# Patient Record
Sex: Female | Born: 1955 | Race: White | Hispanic: No | Marital: Married
Health system: Southern US, Community
[De-identification: ages and names within clinical notes are randomized; demographics above are authoritative.]

---

## 2000-03-29 ENCOUNTER — Ambulatory Visit (HOSPITAL_COMMUNITY): Admission: RE | Admit: 2000-03-29 | Discharge: 2000-03-29 | Payer: Self-pay | Admitting: *Deleted

## 2000-03-29 ENCOUNTER — Encounter: Payer: Self-pay | Admitting: *Deleted

## 2000-03-30 ENCOUNTER — Emergency Department (HOSPITAL_COMMUNITY): Admission: EM | Admit: 2000-03-30 | Discharge: 2000-03-30 | Payer: Self-pay | Admitting: Emergency Medicine

## 2000-07-02 ENCOUNTER — Emergency Department (HOSPITAL_COMMUNITY): Admission: EM | Admit: 2000-07-02 | Discharge: 2000-07-02 | Payer: Self-pay | Admitting: Emergency Medicine

## 2002-03-02 ENCOUNTER — Other Ambulatory Visit: Admission: RE | Admit: 2002-03-02 | Discharge: 2002-03-02 | Payer: Self-pay | Admitting: Obstetrics and Gynecology

## 2002-03-14 ENCOUNTER — Encounter (INDEPENDENT_AMBULATORY_CARE_PROVIDER_SITE_OTHER): Payer: Self-pay | Admitting: *Deleted

## 2002-03-14 ENCOUNTER — Ambulatory Visit (HOSPITAL_COMMUNITY): Admission: RE | Admit: 2002-03-14 | Discharge: 2002-03-14 | Payer: Self-pay | Admitting: Obstetrics and Gynecology

## 2002-03-14 ENCOUNTER — Inpatient Hospital Stay (HOSPITAL_COMMUNITY): Admission: AD | Admit: 2002-03-14 | Discharge: 2002-03-19 | Payer: Self-pay | Admitting: Psychiatry

## 2002-03-24 ENCOUNTER — Encounter: Admission: RE | Admit: 2002-03-24 | Discharge: 2002-03-24 | Payer: Self-pay | Admitting: *Deleted

## 2002-03-27 ENCOUNTER — Encounter: Admission: RE | Admit: 2002-03-27 | Discharge: 2002-03-27 | Payer: Self-pay | Admitting: *Deleted

## 2002-04-03 ENCOUNTER — Encounter: Admission: RE | Admit: 2002-04-03 | Discharge: 2002-04-03 | Payer: Self-pay | Admitting: *Deleted

## 2002-04-08 ENCOUNTER — Encounter: Admission: RE | Admit: 2002-04-08 | Discharge: 2002-04-08 | Payer: Self-pay | Admitting: *Deleted

## 2002-04-14 ENCOUNTER — Encounter: Admission: RE | Admit: 2002-04-14 | Discharge: 2002-04-14 | Payer: Self-pay | Admitting: Psychiatry

## 2002-04-28 ENCOUNTER — Encounter: Admission: RE | Admit: 2002-04-28 | Discharge: 2002-04-28 | Payer: Self-pay | Admitting: Psychiatry

## 2002-04-30 ENCOUNTER — Encounter: Admission: RE | Admit: 2002-04-30 | Discharge: 2002-04-30 | Payer: Self-pay | Admitting: *Deleted

## 2002-05-11 ENCOUNTER — Encounter: Admission: RE | Admit: 2002-05-11 | Discharge: 2002-05-11 | Payer: Self-pay | Admitting: Psychiatry

## 2002-05-19 ENCOUNTER — Encounter: Admission: RE | Admit: 2002-05-19 | Discharge: 2002-05-19 | Payer: Self-pay | Admitting: Psychiatry

## 2002-05-24 ENCOUNTER — Emergency Department (HOSPITAL_COMMUNITY): Admission: EM | Admit: 2002-05-24 | Discharge: 2002-05-24 | Payer: Self-pay | Admitting: Emergency Medicine

## 2002-06-09 ENCOUNTER — Encounter: Admission: RE | Admit: 2002-06-09 | Discharge: 2002-06-09 | Payer: Self-pay | Admitting: Psychiatry

## 2002-06-22 ENCOUNTER — Encounter: Admission: RE | Admit: 2002-06-22 | Discharge: 2002-06-22 | Payer: Self-pay | Admitting: Psychiatry

## 2002-08-24 ENCOUNTER — Encounter: Admission: RE | Admit: 2002-08-24 | Discharge: 2002-08-24 | Payer: Self-pay | Admitting: Psychiatry

## 2002-09-01 ENCOUNTER — Encounter: Admission: RE | Admit: 2002-09-01 | Discharge: 2002-09-01 | Payer: Self-pay | Admitting: *Deleted

## 2002-09-14 ENCOUNTER — Encounter: Admission: RE | Admit: 2002-09-14 | Discharge: 2002-09-14 | Payer: Self-pay | Admitting: Psychiatry

## 2002-09-29 ENCOUNTER — Encounter: Admission: RE | Admit: 2002-09-29 | Discharge: 2002-09-29 | Payer: Self-pay | Admitting: Psychiatry

## 2002-11-30 ENCOUNTER — Inpatient Hospital Stay (HOSPITAL_COMMUNITY): Admission: EM | Admit: 2002-11-30 | Discharge: 2002-12-04 | Payer: Self-pay | Admitting: Psychiatry

## 2003-02-22 ENCOUNTER — Encounter: Admission: RE | Admit: 2003-02-22 | Discharge: 2003-02-22 | Payer: Self-pay | Admitting: Psychiatry

## 2003-03-30 ENCOUNTER — Other Ambulatory Visit: Admission: RE | Admit: 2003-03-30 | Discharge: 2003-03-30 | Payer: Self-pay | Admitting: Obstetrics and Gynecology

## 2003-04-01 ENCOUNTER — Encounter: Payer: Self-pay | Admitting: Obstetrics and Gynecology

## 2003-04-01 ENCOUNTER — Ambulatory Visit (HOSPITAL_COMMUNITY): Admission: RE | Admit: 2003-04-01 | Discharge: 2003-04-01 | Payer: Self-pay | Admitting: Obstetrics and Gynecology

## 2003-04-07 ENCOUNTER — Encounter: Payer: Self-pay | Admitting: Obstetrics and Gynecology

## 2003-04-07 ENCOUNTER — Ambulatory Visit (HOSPITAL_COMMUNITY): Admission: RE | Admit: 2003-04-07 | Discharge: 2003-04-07 | Payer: Self-pay | Admitting: Obstetrics and Gynecology

## 2003-04-26 ENCOUNTER — Encounter: Admission: RE | Admit: 2003-04-26 | Discharge: 2003-04-26 | Payer: Self-pay | Admitting: *Deleted

## 2003-04-26 ENCOUNTER — Encounter: Payer: Self-pay | Admitting: Obstetrics and Gynecology

## 2003-04-26 ENCOUNTER — Ambulatory Visit (HOSPITAL_COMMUNITY): Admission: RE | Admit: 2003-04-26 | Discharge: 2003-04-26 | Payer: Self-pay | Admitting: Obstetrics and Gynecology

## 2003-05-12 ENCOUNTER — Encounter: Payer: Self-pay | Admitting: Obstetrics and Gynecology

## 2003-05-12 ENCOUNTER — Ambulatory Visit (HOSPITAL_COMMUNITY): Admission: AD | Admit: 2003-05-12 | Discharge: 2003-05-12 | Payer: Self-pay | Admitting: Obstetrics and Gynecology

## 2004-04-17 ENCOUNTER — Other Ambulatory Visit: Admission: RE | Admit: 2004-04-17 | Discharge: 2004-04-17 | Payer: Self-pay | Admitting: Obstetrics and Gynecology

## 2005-02-17 ENCOUNTER — Emergency Department (HOSPITAL_COMMUNITY): Admission: EM | Admit: 2005-02-17 | Discharge: 2005-02-17 | Payer: Self-pay | Admitting: Emergency Medicine

## 2005-06-18 ENCOUNTER — Other Ambulatory Visit: Admission: RE | Admit: 2005-06-18 | Discharge: 2005-06-18 | Payer: Self-pay | Admitting: Obstetrics and Gynecology

## 2008-04-22 ENCOUNTER — Encounter: Admission: RE | Admit: 2008-04-22 | Discharge: 2008-04-22 | Payer: Self-pay | Admitting: Obstetrics and Gynecology

## 2008-04-26 ENCOUNTER — Encounter (INDEPENDENT_AMBULATORY_CARE_PROVIDER_SITE_OTHER): Payer: Self-pay | Admitting: Diagnostic Radiology

## 2008-04-26 ENCOUNTER — Encounter: Admission: RE | Admit: 2008-04-26 | Discharge: 2008-04-26 | Payer: Self-pay | Admitting: Obstetrics and Gynecology

## 2008-05-20 ENCOUNTER — Encounter (INDEPENDENT_AMBULATORY_CARE_PROVIDER_SITE_OTHER): Payer: Self-pay | Admitting: Surgery

## 2008-05-20 ENCOUNTER — Ambulatory Visit (HOSPITAL_BASED_OUTPATIENT_CLINIC_OR_DEPARTMENT_OTHER): Admission: RE | Admit: 2008-05-20 | Discharge: 2008-05-20 | Payer: Self-pay | Admitting: Surgery

## 2008-05-20 ENCOUNTER — Encounter: Admission: RE | Admit: 2008-05-20 | Discharge: 2008-05-20 | Payer: Self-pay | Admitting: Surgery

## 2008-08-11 ENCOUNTER — Encounter: Admission: RE | Admit: 2008-08-11 | Discharge: 2008-08-11 | Payer: Self-pay | Admitting: Gastroenterology

## 2008-08-23 ENCOUNTER — Encounter: Admission: RE | Admit: 2008-08-23 | Discharge: 2008-08-23 | Payer: Self-pay | Admitting: Obstetrics and Gynecology

## 2009-07-27 ENCOUNTER — Encounter: Admission: RE | Admit: 2009-07-27 | Discharge: 2009-07-27 | Payer: Self-pay | Admitting: Obstetrics and Gynecology

## 2009-08-01 ENCOUNTER — Encounter: Admission: RE | Admit: 2009-08-01 | Discharge: 2009-08-01 | Payer: Self-pay | Admitting: Obstetrics and Gynecology

## 2009-08-29 ENCOUNTER — Emergency Department (HOSPITAL_COMMUNITY): Admission: EM | Admit: 2009-08-29 | Discharge: 2009-08-29 | Payer: Self-pay | Admitting: Emergency Medicine

## 2009-09-27 ENCOUNTER — Encounter: Admission: RE | Admit: 2009-09-27 | Discharge: 2009-09-27 | Payer: Self-pay | Admitting: Surgery

## 2009-09-27 ENCOUNTER — Ambulatory Visit (HOSPITAL_BASED_OUTPATIENT_CLINIC_OR_DEPARTMENT_OTHER): Admission: RE | Admit: 2009-09-27 | Discharge: 2009-09-27 | Payer: Self-pay | Admitting: Surgery

## 2009-09-27 ENCOUNTER — Encounter (INDEPENDENT_AMBULATORY_CARE_PROVIDER_SITE_OTHER): Payer: Self-pay | Admitting: Surgery

## 2010-10-26 ENCOUNTER — Ambulatory Visit: Payer: Self-pay | Admitting: Specialist

## 2010-12-30 ENCOUNTER — Emergency Department: Payer: Self-pay | Admitting: Emergency Medicine

## 2011-03-01 ENCOUNTER — Ambulatory Visit: Payer: Self-pay | Admitting: Specialist

## 2011-03-07 ENCOUNTER — Ambulatory Visit: Payer: Self-pay | Admitting: Specialist

## 2011-04-07 LAB — POCT I-STAT, CHEM 8
Creatinine, Ser: 0.7 mg/dL (ref 0.4–1.2)
HCT: 41 % (ref 36.0–46.0)
Hemoglobin: 13.9 g/dL (ref 12.0–15.0)
Potassium: 3.8 mEq/L (ref 3.5–5.1)
Sodium: 138 mEq/L (ref 135–145)

## 2011-04-07 LAB — POCT CARDIAC MARKERS
CKMB, poc: <1 ng/mL — ABNORMAL LOW (ref 1.0–8.0)
Myoglobin, poc: 55.1 ng/mL (ref 12–200)

## 2011-05-15 NOTE — Op Note (Signed)
Sydney Andrews, Sydney Andrews             ACCOUNT NO.:  [id number]   MEDICAL RECORD NO.:  [id number]          PATIENT TYPE:  AMB   LOCATION:  DSC                          FACILITY:  [facility]   PHYSICIAN:  Currie Paris, M.D.DATE OF BIRTH:  16-Feb-1956   DATE OF PROCEDURE:  05/20/2008  DATE OF DISCHARGE:                               OPERATIVE REPORT   PREOPERATIVE DIAGNOSES:  Calcifications, left breast, upper outer  quadrant with atypical hyperplasia, on core biopsy.   POSTOPERATIVE DIAGNOSES:  Calcifications, left breast, upper outer  quadrant with atypical hyperplasia, on core biopsy.   OPERATION:  Needle-localized excisional biopsy of left breast  calcifications.   SURGEON:  Currie Paris, MD   ANESTHESIA:  MAC.   CLINICAL HISTORY:  This is a 55 year old lady who had an area of  abnormality biopsied and some atypical hyperplasia was seen.  Wider  excisional biopsy was recommended.  A clip had been placed at the time  of the original mammogram.   DESCRIPTION OF PROCEDURE:  The patient was seen in the holding area.  She had no further questions.  We both initialed the left breast as the  operative site.  I reviewed the mammogram films and the guidewire  appeared to go beyond the clip and be very close to it tracking from  lateral to medial on the CC view.   The patient was taken to the operating room.  After satisfactory IV  sedation, the left breast was prepped and draped.  The time-out was  done.   I infiltrated Marcaine and made a transverse incision starting about a  centimeter medial to where the guidewire entered the skin, divided the  fatty tissue, and then a little bit of breast tissue laterally until I  could identify the guidewire, and then I manipulated that into the  wound.  I then grasped the guidewire tract and excised a cylinder of  tissue around it with continued ongoing traction, I was able to do so  with the guidewire tracking fairly close to my deep  margin, but I was  also fairly close to chest wall in doing this.  I got beyond the tip and  divided it so that we had an excisional biopsy, and we did a specimen  mammogram.  I put additional local and using a combination 1% Xylocaine  and 0.5% Marcaine with epinephrine mixed equally.  I put it in the  deeper layers and around the chest wall.   Specimen mammogram showed the guidewire and the clip to be in it, but  there were some calcifications noted at the very medial end of the  biopsy, so I went back and took additional centimeters of tissue and by  this time, I was well under the areolar complex.   I made sure everything was dry, put in more local, and closed in layers  with 3-0 Vicryl and 4-0 Monocryl subcuticular and Dermabond.   The patient the tolerated procedure well, and there no complications.  All counts were correct.      Currie Paris, M.D.  Electronically Signed     CJS/MEDQ  D:  05/20/2008  T:  05/21/2008  Job:  [id number]

## 2011-05-18 NOTE — H&P (Signed)
NAME:  Sydney Andrews, Sydney Andrews                       ACCOUNT NO.:  [id number]   MEDICAL RECORD NO.:  [id number]                   PATIENT TYPE:  IPS   LOCATION:  0303                                 FACILITY:  [facility]   PHYSICIAN:  Jeanice Lim, M.D.              DATE OF BIRTH:  11-24-1956   DATE OF ADMISSION:  11/30/2002  DATE OF DISCHARGE:  12/04/2002                         PSYCHIATRIC ADMISSION ASSESSMENT   IDENTIFYING INFORMATION:  This is a 55 year old married white female who is  a voluntary admission.   HISTORY OF PRESENT ILLNESS:  This patient was transferred from [facility].  She has a history of depression and went off Prozac and  Wellbutrin when she initially became pregnant, then she experienced a fetal  death at 10 weeks of pregnancy and was admitted to [facility] on March  15 for a D&C.  She endorses depression symptoms over the past few weeks and  suicidal thoughts for the past few days, stating I've nothing to live for,  no purpose in my life.  I don't know what to do.  Feeling hopeless and  depressed after losing the baby since she had worked on becoming pregnant  through in vitro fertilization for the past 2 years.  The patient has had  thoughts of jumping out in front of a car.  She denies any homicidal  ideation or auditory or visual hallucinations.  She endorses general  feelings of sadness, hopelessness and helplessness.   PAST PSYCHIATRIC HISTORY:  This is the patient's first psychiatric inpatient  admission.  She does have a history of 1 prior suicide attempt by overdose  in the past.  This overdose was approximately 20 years ago.   SOCIAL HISTORY:  The patient lives in [place] with her husband and her  husband's 2 children by a prior marriage who are age 69 and 3.  She is  thinking that she may choose to adopt now that she has spent so much effort  trying to get pregnant and now lost the baby.  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   FAMILY HISTORY:  Unremarkable.   ALCOHOL AND DRUG HISTORY:  The patient denies any substance abuse now or in  the past.   PAST MEDICAL HISTORY:  The patient is generally healthy, has been followed  primarily by her Ob/Gyn physician.  Medical problems:  She denies.  She is  status post spontaneous abortion at 10 weeks fetal gestation.   MEDICATIONS:  Darvocet for discomfort following her D&C.   DRUG ALLERGIES:  None.   POSITIVE PHYSICAL FINDINGS:  The patient's physical examination was done at  [facility] and is noted in the record.  The patient's vital signs are  within normal limits.  On admission to the unit, she is 5 feet 2 inches  tall, 129 pounds.  Her CBC and metabolic panel were within normal limits.  Her thyroid panel  is currently pending.  She is having a moderate amount of  vaginal bleeding with very few clots, nothing excessive and she has no other  somatic complaints.   REVIEW OF SYSTEMS:  Remarkable for some suicidal thoughts and being very  depressed since she learned the pregnancy was not going well, but prior to  that she was quite healthy, denies any other somatic complaints.   MENTAL STATUS EXAM:  This is a fatigued looking female who is fully alert  and otherwise healthy in appearance.  She is in no acute distress.  She has  sad, blunted affect, is cooperative and polite.  Speech is normal.  Her mood  is generally depressed, feeling hopeless about the future and grieving the  loss of her Prenancy and the future that she hoped to have with that child.  She feels that she has nothing to look forward to, after she had gotten her  hopes up about the pregnancy.  Thought process includes vague suicidal  thoughts without any clear plan of how she would really commit suicide.  She  did have thoughts before about possibly jumping out of the car when she was  riding in it.  Today she is able to promise safety on the unit.  Thoughts  are generally logical,  no signs of psychosis, no homicidal ideation.  Cognitively she is intact and oriented x3.  Intelligence is average, insight  is fair.  Judgment and impulse control within normal limits.   ADMISSION DIAGNOSIS:   AXIS I:  Major depression, recurrent, severe.   AXIS II:  Deferred.   AXIS III:  Status post D&C on 3/15.   AXIS IV:  Moderate to severe, grief over the loss of her pregnancy.  Having  supportive family and supportive husband is an asset to her.   AXIS V:  Current 32, past year 84.   INITIAL PLAN OF CARE:  Voluntarily admit the patient to evaluate her mood  and to alleviate her suicidal ideation.  We are going to restart her Prozac  at 20 mg daily since she had had good results from that in the distant past,  and we will also restart her Wellbutrin SR at 100 mg b.i.d.  Meanwhile, we  will give her Darvocet for discomfort and monitor her vaginal bleeding  closely.   ESTIMATED LENGTH OF STAY:  5 days.      Margaret A. Stephannie Peters                   Jeanice Lim, M.D.    MAS/MEDQ  D:  03/15/2003  T:  03/15/2003  Job:  [id number]

## 2011-05-18 NOTE — Op Note (Signed)
[facility]  Patient:    Sydney Andrews, Sydney Andrews Visit Number: [id number] MRN: [id number]          Service Type: PSY Location: 300 0304 01 Attending Physician:  Jeanice Lim Dictated by:   Guy Sandifer Arleta Creek, M.D. Proc. Date: 03/14/02 Admit Date:  03/14/2002                             Operative Report  PREOPERATIVE DIAGNOSES:       Missed abortion.  POSTOPERATIVE DIAGNOSES:      Missed abortion.  PROCEDURE:                    Dilatation and evacuation.  SURGEON:                      Guy Sandifer. Arleta Creek, M.D.  ANESTHESIA:                   MAC with 1% Xylocaine paracervical block.  ESTIMATED BLOOD LOSS:         Less than 100 cc.  INDICATIONS AND CONSENT:      This patient is a 55 year old married white female G4, P2, AB1 at approximately 24 5/7 weeks estimated gestational age with missed abortion.  Details are dictated in the history and physical. Dilatation and evacuation has been discussed with the patient and her husband. Potential risks and complications were discussed including, but not limited to, infection, uterine perforation, organ damage, bleeding requiring transfusion of blood products with possible transfusion reaction, HIV and hepatitis acquisition, DVT, PE, injury and synechia formation, hysterectomy. All questions have been answered and consent is signed on the chart.  PROCEDURE:                    Patient is taken to the operating room and placed in dorsal supine position where sedation is given.  She is then placed in the dorsal lithotomy position where she is prepped, bladder straight catheterized, and she is draped in a sterile fashion.  Bivalve speculum was placed in the vagina.  The anterior cervical lip is injected with 1% Xylocaine and grasped with a single tooth tenaculum.  Paracervical block is placed in the 2, 4, 5, 7, 8, and 10 oclock positions with approximately 20 cc total of 1% Xylocaine plain.  Cervix is gently  progressively dilated to a 31 Pratt dilator.  A #8 curved suction curette is then passed and suction curettage is carried out for obvious products of conception.  Pitocin is added to the IV after initial pass of the suction curette.  Alternating sharp and suction curettage is carried out until the cavity is clean.  Good hemostasis is noted. Instruments are removed.  The patient is awakened and taken to the recovery room in stable condition.  Blood type is A+. Dictated by:   Guy Sandifer Arleta Creek, M.D. Attending Physician:  Jeanice Lim DD:  03/14/02 TD:  03/16/02 Job: [id number]

## 2011-05-18 NOTE — Discharge Summary (Signed)
[facility]  Patient:    Sydney Andrews, Sydney Andrews Visit Number: [id number] MRN: [id number]          Service Type: PSY Location: 300 0303 01 Attending Physician:  Jeanice Lim Dictated by:   Jeanice Lim, M.D. Admit Date:  03/14/2002 Discharge Date: 03/19/2002                             Discharge Summary  IDENTIFYING DATA:  This is a 55 year old married Caucasian female voluntarily admitted with a history of depression, off of Prozac and Wellbutrin for a week after becoming pregnant.  At 10 weeks, the patient went to the [facility] for a [facility] and has had suicidal ideation, feeling that she had nothing to live for.  ADMISSION MEDICATIONS:  Darvocet for pain.  ALLERGIES:  No known allergies.  PHYSICAL EXAMINATION:  GENERAL:  Essentially within normal limits.  NEUROLOGIC:  Nonfocal.  ROUTINE ADMISSION LABORATORY DATA:  Essentially within normal limits.  MENTAL STATUS EXAMINATION:  Fully alert, tired looking female in no acute distress, cooperative, polite.  Speech: Within normal limits.  Mood: Depressed, feeling hopeless.  Affect: Restricted.  Thought process: Goal directed.  Thought content: Positive for vague suicidal ideation, contracting for safety, no psychotic symptoms.  Cognitive: Intact.  Judgment and insight: Limited.  ADMITTING DIAGNOSES: Axis I:    Major depression, recurrent, severe. Axis II:   None. Axis III:  Status post dilatation and curettage. Axis IV:   Moderate psychosocial problems. Axis V:    30/70  HOSPITAL COURSE:  The patient was admitted and ordered routine p.r.n. medications. The patient was adjusted on Wellbutrin, Restoril used to restore sleep, and Prozac continued.  Wellbutrin was further tapered to not interfere sleep.  The patient tolerated medication changes well and reported improvement.  CONDITION AT DISCHARGE:  Improved.  Mood: More euthymic.  Affect: Brighter. Thought process: Goal directed.   Thought content: Negative for suicidal or homicidal ideations or psychotic symptoms.  The patient reported feeling more hopeful and reported motivation to be compliant with the followup plan.  DISCHARGE MEDICATIONS: 1. Prozac 20 mg q.a.m. 2. Humibid LA 600 mg b.i.d. 3. Wellbutrin SR 100 mg q.a.m. and 3 p.m. 4. Restoril 30 mg q.h.s. p.r.n. insomnia. 5. Toradol 10 mg q.8h. p.r.n. pain/cramping.  FOLLOWUP: 1. Dr. Lourdes Sledge on April 9 at 3 p.m. 2. Abel Presto on March 25 at 1 p.m.  DISCHARGE DIAGNOSES: Axis I:    Major depression, recurrent, severe. Axis II:   None. Axis III:  Status post dilatation and curettage. Axis IV:   Moderate psychosocial problems. Axis V:    Global assessment of functioning on discharge was 55. Dictated by:   Jeanice Lim, M.D. Attending Physician:  Jeanice Lim DD:  04/30/02 TD:  05/01/02 Job: [id number]

## 2011-05-18 NOTE — H&P (Signed)
[facility]  Patient:    Sydney Andrews, Sydney Andrews Visit Number: [id number] MRN: [id number]          Service Type: Attending:  Guy Sandifer. Arleta Creek, M.D. Dictated by:   Guy Sandifer Arleta Creek, M.D. Adm. Date:  03/14/02                           History and Physical  CHIEF COMPLAINT:              Missed abortion.  HISTORY OF PRESENT ILLNESS:   This patient is a 55 year old, married, white female, G4, P2, AB1, status post reversal of tubal ligation in December of 2000, who had conceived with in vitro fertilization this pregnancy. Ultrasound on March 06, 2002, revealed an intrauterine pregnancy at 8 weeks and 5 days with a fetal heart rate of 173 beats per minute.  On March 13, 2002, the patient was in her infertility specialists office in [place], [place].  An ultrasound there failed to detect a heartbeat.  She then presented to my office and again on March 13, 2002, ultrasound revealed a crown-rump length of 9 weeks and 5 days and no motion or fetal heart rate was noted upon prolonged observation.  A diagnosis of missed abortion was made. The patient requests dilatation and evacuation as soon as possible.  The potential risks and complications have been discussed with the patient and her husband, including, but no limited to infection, uterine perforation, organ damage, laparoscopy, laparotomy, bleeding requiring transfusion of blood products with possible transfusion reaction, HIV, and hepatitis acquisition, DVT, PE, pneumonia, intrauterine synechia formation with secondary abnormal uterine bleeding and infertility and hysterectomy.  PAST MEDICAL HISTORY: 1. History of exercise-induced asthma. 2. Depression in the past.  PAST SURGICAL HISTORY:        Fallopian tube reanastomosis in December of 2000.  OBSTETRICAL HISTORY:          Vaginal delivery x 2.  FAMILY HISTORY:               Chronic hypertension in father.  Adult onset diabetes in  maternal grandmother.  Colon cancer in maternal grandmother. Breast cancer in paternal grandmother.  Osteoporosis in maternal grandmother.  MEDICATIONS:                  Prenatal vitamins.  ALLERGIES:                    No known drug allergies.  SOCIAL HISTORY:               The patient denies tobacco, alcohol, or drug abuse.  REVIEW OF SYSTEMS:            Negative, except as above.  PHYSICAL EXAMINATION:         Height 5 feet 2 inches.  Weight 131 pounds.  VITAL SIGNS:                  Blood pressure 106/56.  HEENT:                        Without thyromegaly.  LUNGS:                        Clear to auscultation.  HEART:                        Regular  rate and rhythm.  BACK:                         Without CVA tenderness.  BREASTS:                      Without mass, retraction, or discharge.  ABDOMEN:                      Soft and nontender without masses.  PELVIC:                       Exam not done.  EXTREMITIES:                  Grossly within normal limits.  NEUROLOGIC:                   Grossly within normal limits.  LABORATORY DATA:              Blood type A+.  Rh antibody screen negative. Toxoplasmosis immune.  VDRL nonreactive.  Rubella titer immune.  Hepatitis B surface antigen negative.  PPD negative.  ASSESSMENT:                   Missed abortion.  PLAN:                         Dilatation and evacuation. Dictated by:   Guy Sandifer Arleta Creek, M.D. Attending:  Guy Sandifer Arleta Creek, M.D. DD:  03/13/02 TD:  03/13/02 Job: [id number]

## 2011-05-18 NOTE — H&P (Signed)
Sydney Andrews, BERHOW                         ACCOUNT NO.:  [id number]   MEDICAL RECORD NO.:  [id number]                   PATIENT TYPE:  IPS   LOCATION:                                       FACILITY:  [facility]   PHYSICIAN:  Jeanice Lim, M.D.              DATE OF BIRTH:  [birth year]   DATE OF ADMISSION:  11/30/2002  DATE OF DISCHARGE:                         PSYCHIATRIC ADMISSION ASSESSMENT   IDENTIFYING. INFORMATION:  The patient is a 55 year old married white female  voluntarily admitted on November 30, 2002.   HISTORY OF PRESENT ILLNESS:  The patient presents with a history of  depression, having suicidal thoughts with no specific plan but was looking  for a painless way to do it.  The patient states she was on the way to her  therapist.  She was advised to come for admission.  The patient reports she  has been having obsessive thoughts of her family issues, treated differently  by her sister-in-law and her mother-in-law.  She reports her sleep has been  decreased, her appetite has been satisfactory.  The patient reports other  stresses also include that she is trying to get pregnant or adopt.  She  feels that her recent change of dosage in antidepressant has made her  depression worse.  She denies any psychotic symptoms.  She is currently  denying any suicidal or homicidal ideation.   PAST PSYCHIATRIC HISTORY:  This is the second visit to [facility]; last visit was in March 2003.  The patient reports she was here for  depression when she had lost her pregnancy.  She has a history of an  overdose one year ago.   SUBSTANCE ABUSE HISTORY:  The patient states she smokes in this facility;  otherwise she reports no cigarette intake.  Occasional alcohol use.  No  substance abuse.   PAST MEDICAL HISTORY:  Primary care Kyndal Heringer: Dr. Dewaine Oats in [place].  Medical problems: None.   MEDICATIONS:  1. Prozac 50 mg, was increased about a week and a half  ago; has been on that     for one year.  2. Wellbutrin 300 mg q.d.  3. Zyprexa 2.5 mg q.h.s.; has been on this since March 2003 prescribed by     Hipolito Bayley, M.D.   DRUG ALLERGIES:  No know allergies.   REVIEW OF SYSTEMS:  Review of systems is negative except for occasional  joint pain in her left elbow and occasional environmental allergies.   PHYSICAL EXAMINATION:  VITAL SIGNS:  Temperature 97.5, heart rate 93,  respirations 18, blood pressure 115/84.  She is 149 pounds.  GENERAL:  The patient is a 55 year old white female in no acute distress.  She is well developed, appears her stated age.  HEENT:  Head: Normocephalic.  She can raise her eyebrows.  Her hair is  blonde and evenly distributed.  Sclerae are clear.  External  ear canals are  patent.  No sinus tenderness, no nasal discharge.  Mucosa is moist with good  dentition, no lesions were seen.  Tongue protrudes to midline without  tremor.  She can clench her teeth and puff out her cheeks.  NECK:  Supple, no JVD, negative lymphadenopathy.  CHEST:  Clear to auscultation.  HEART:  Regular rate and rhythm.  BREAST:  Exam was deferred.  ABDOMEN:  Soft, nontender, no CVA tenderness.  MUSCULOSKELETAL:  Spine is straight.  SKIN:  Warm and dry with good turgor.  Nailbeds are pink with good capillary  refill.  NEUROLOGIC:  Oriented x 3.  Cranial nerves grossly intact.  Good grip  strength bilaterally.  No involuntary movements.   LABORATORY DATA:  CBC: Hematocrit 35.2.  CMET is within normal limits.   SOCIAL HISTORY:  She is a 54 year old married white female, married for  eight years.  This is her second marriage.  She has two children from her  first marriage that are 38 and 53 years of age.  She lives with her husband.  She owns her own business.  The patient is a hair stylist.  She has no legal  problems.   FAMILY HISTORY:  Family history is unknown.   MENTAL STATUS EXAM:  She is an alert, middle-aged female, cooperative  with  good eye contact.  Speech is clear.  Mood is depressed.  Affect is  depressed, pleasant.  She is somewhat anxious.  Thought processes: The  patient reports some obsessive thinking about conflicts with her family.  Otherwise, very coherent, goal directed, no hallucinations, no current  suicidal or homicidal ideation..  Cognitive: Intact.  Memory is good.  Judgment and insight are fair.   ADMISSION DIAGNOSES:   AXIS I:  Major depression, recurrent without psychotic features.   AXIS II:  Deferred.   AXIS III:  None.   AXIS IV:  Problems with primary support group, other psychosocial problems.   AXIS V:  Current is 30, estimated this past year is 70.   INITIAL PLAN OF CARE:  Plan is a voluntary admission to [facility] for depression and some suicidal thoughts.  Contract for safety,  check every 15 minutes.  Will obtain labs.  Will continue with Zyprexa and  Wellbutrin.  We will discontinue the Prozac and add Lexapro to decrease  depressive symptoms and obsessive thinking.  Family session prior to  discharge.  The patient is to attend groups, to increase coping skills, to  follow up with Hipolito Bayley, M.D.   ESTIMATED LENGTH OF STAY:  Three to four days.     Landry Corporal, N.P.                       Jeanice Lim, M.D.    JO/MEDQ  D:  12/01/2002  T:  12/01/2002  Job:  [id number]

## 2011-05-18 NOTE — Discharge Summary (Signed)
NAME:  Sydney Andrews, Sydney Andrews NO.:  [id number]   MEDICAL RECORD NO.:  [id number]                   PATIENT TYPE:  IPS   LOCATION:  0303                                 FACILITY:  [facility]   PHYSICIAN:  Jeanice Lim, M.D.              DATE OF BIRTH:  19-Jul-1956   DATE OF ADMISSION:  11/30/2002  DATE OF DISCHARGE:  12/04/2002                                 DISCHARGE SUMMARY   IDENTIFYING DATA:  This is a 55 year old married Caucasian female  voluntarily admitted with a history of depression and suicidal thoughts,  planning a painless way to end her life, obsessing on family issues, treated  differently by sister-in-law and mother-in-law, describing a worsening  depression and feels a recent change in antidepressants have made things  worse.   MEDICATIONS:  Prozac 50 mg (changed a week and a half ago), Wellbutrin 300  mg q.a.m. and Zyprexa 2.5 mg q.h.s. (prescribed by Dr. Lourdes Sledge).   ALLERGIES:  No known drug allergies.   PHYSICAL EXAMINATION:  Essentially within normal limits.  Neurologically  nonfocal.   LABORATORY DATA:  Routine admission labs essentially within normal limits  which includes CBC and CMET.   MENTAL STATUS EXAM:  Alert, middle-aged female cooperative with good eye  contact.  Speech clear.  Mood depressed.  Affect restricted.  Thought  process goal directed, somewhat anxious, obsessive thinking about conflicts  with family.  Otherwise coherent.  No psychotic symptoms and no acute  suicidal or homicidal ideation.  Cognition intact.  Judgment and insight  fair.   ADMISSION DIAGNOSES:   AXIS I:  Major depression, recurrent, severe without psychotic features.   AXIS II:  None.   AXIS III:  None.   AXIS IV:  Moderate (stressors with primary support system).   AXIS V:  30/70.   HOSPITAL COURSE:  The patient was admitted and ordered routine p.r.n.  medications and underwent further monitoring.  Was encouraged to participate  in  individual, group and milieu therapy.  Was resumed on psychotropics.  Lexapro was started and titrated targeting depressive symptoms.  Wellbutrin  was changed to Wellbutrin XL 300 mg and patient was treated with Midrin for  headaches which she had a positive response to.  The patient reported an  overwhelming positive response to clinical intervention.   CONDITION ON DISCHARGE:  Markedly improved.  Mood more euthymic.  Affect  brighter.  Thought processes goal directed.  Thought content negative for  dangerous ideation or psychotic symptoms.  The patient reported motivation  to be compliant with the aftercare plan.   DISCHARGE MEDICATIONS:  1. Zyprexa 2.5 mg q.h.s.  2. Lexapro 20 mg q.a.m.  3. Wellbutrin XL 300 mg q.a.m.  4. Allegra-D 1 q.a.m.  5. Ambien 10 mg q.h.s. p.r.n. insomnia.  6. Ativan 0.5 mg q.8h. p.r.n. anxiety.   FOLLOW UP:  The patient was to follow up with Dr. Lourdes Sledge on January 01, 2003 and 10:30 a.m. and [place] within seven days of discharge.   DISCHARGE DIAGNOSES:   AXIS I:  Major depression, recurrent, severe without psychotic features.   AXIS II:  None.   AXIS III:  None.   AXIS IV:  Moderate (stressors with primary support system).   AXIS V:  Global Assessment of Functioning on discharge 55.                                               Jeanice Lim, M.D.    JEM/MEDQ  D:  12/07/2002  T:  12/07/2002  Job:  [id number]

## 2011-05-18 NOTE — Discharge Summary (Signed)
[facility]  Patient:    Sydney Andrews, Sydney Andrews Visit Number: [id number] MRN: [id number]          Service Type: PSY Location: 300 0303 01 Attending Physician:  Jeanice Lim Dictated by:   Jeanice Lim, M.D. Admit Date:  03/14/2002 Discharge Date: 03/19/2002                             Discharge Summary  no dictation Dictated by:   Jeanice Lim, M.D. Attending Physician:  Jeanice Lim DD:  05/13/02 TD:  05/14/02 Job: [id number]

## 2011-08-20 ENCOUNTER — Emergency Department (HOSPITAL_COMMUNITY)
Admission: EM | Admit: 2011-08-20 | Discharge: 2011-08-20 | Disposition: A | Discharge status: Home / Self Care | Payer: Managed Care, Other (non HMO) | Attending: Emergency Medicine | Admitting: Emergency Medicine

## 2011-08-20 DIAGNOSIS — R5381 Other malaise: Secondary | ICD-10-CM | POA: Insufficient documentation

## 2011-08-20 DIAGNOSIS — R5383 Other fatigue: Secondary | ICD-10-CM | POA: Insufficient documentation

## 2011-08-20 DIAGNOSIS — T424X4A Poisoning by benzodiazepines, undetermined, initial encounter: Secondary | ICD-10-CM | POA: Insufficient documentation

## 2011-08-20 DIAGNOSIS — F3289 Other specified depressive episodes: Secondary | ICD-10-CM | POA: Insufficient documentation

## 2011-08-20 DIAGNOSIS — R4182 Altered mental status, unspecified: Secondary | ICD-10-CM | POA: Insufficient documentation

## 2011-08-20 DIAGNOSIS — Z79899 Other long term (current) drug therapy: Secondary | ICD-10-CM | POA: Insufficient documentation

## 2011-08-20 DIAGNOSIS — T43502A Poisoning by unspecified antipsychotics and neuroleptics, intentional self-harm, initial encounter: Secondary | ICD-10-CM | POA: Insufficient documentation

## 2011-08-20 DIAGNOSIS — T438X2A Poisoning by other psychotropic drugs, intentional self-harm, initial encounter: Secondary | ICD-10-CM | POA: Insufficient documentation

## 2011-08-20 DIAGNOSIS — F329 Major depressive disorder, single episode, unspecified: Secondary | ICD-10-CM | POA: Insufficient documentation

## 2011-08-20 LAB — CBC
HCT: 32.4 % — ABNORMAL LOW (ref 36.0–46.0)
Hemoglobin: 11.1 g/dL — ABNORMAL LOW (ref 12.0–15.0)
MCH: 30.1 pg (ref 26.0–34.0)
MCHC: 34.3 g/dL (ref 30.0–36.0)
MCV: 87.8 fL (ref 78.0–100.0)

## 2011-08-20 LAB — GLUCOSE, CAPILLARY

## 2011-08-20 LAB — RAPID URINE DRUG SCREEN, HOSP PERFORMED
Amphetamines: NOT DETECTED
Barbiturates: NOT DETECTED

## 2011-08-20 LAB — COMPREHENSIVE METABOLIC PANEL
AST: 10 U/L (ref 0–37)
BUN: 10 mg/dL (ref 6–23)
CO2: 27 mEq/L (ref 19–32)
Chloride: 107 mEq/L (ref 96–112)
Creatinine, Ser: 0.67 mg/dL (ref 0.50–1.10)
GFR calc non Af Amer: >60 mL/min (ref >60)
Total Bilirubin: 0.3 mg/dL (ref 0.3–1.2)

## 2011-08-20 LAB — DIFFERENTIAL
Lymphocytes Relative: 36 % (ref 12–46)
Lymphs Abs: 2.4 10*3/uL (ref 0.7–4.0)
Monocytes Absolute: 0.4 10*3/uL (ref 0.1–1.0)
Monocytes Relative: 7 % (ref 3–12)
Neutro Abs: 3.5 10*3/uL (ref 1.7–7.7)

## 2011-08-20 LAB — URINALYSIS, ROUTINE W REFLEX MICROSCOPIC
Glucose, UA: NEGATIVE mg/dL
Hgb urine dipstick: NEGATIVE
Leukocytes, UA: NEGATIVE
Specific Gravity, Urine: 1.012 (ref 1.005–1.030)
Urobilinogen, UA: 0.2 mg/dL (ref 0.0–1.0)

## 2011-08-20 LAB — SALICYLATE LEVEL: Salicylate Lvl: <2 mg/dL — ABNORMAL LOW (ref 2.8–20.0)

## 2011-08-20 LAB — ACETAMINOPHEN LEVEL: Acetaminophen (Tylenol), Serum: <15 ug/mL (ref 10–30)

## 2011-08-21 LAB — URINE CULTURE

## 2013-02-09 ENCOUNTER — Other Ambulatory Visit: Payer: Self-pay | Admitting: Obstetrics and Gynecology

## 2013-02-09 DIAGNOSIS — R928 Other abnormal and inconclusive findings on diagnostic imaging of breast: Secondary | ICD-10-CM

## 2013-02-12 ENCOUNTER — Other Ambulatory Visit: Payer: Managed Care, Other (non HMO)

## 2013-03-04 ENCOUNTER — Ambulatory Visit
Admission: RE | Admit: 2013-03-04 | Discharge: 2013-03-04 | Disposition: A | Discharge status: Home / Self Care | Payer: Managed Care, Other (non HMO) | Source: Ambulatory Visit | Attending: Obstetrics and Gynecology | Admitting: Obstetrics and Gynecology

## 2013-03-04 ENCOUNTER — Other Ambulatory Visit: Payer: Self-pay | Admitting: Obstetrics and Gynecology

## 2013-03-04 DIAGNOSIS — R928 Other abnormal and inconclusive findings on diagnostic imaging of breast: Secondary | ICD-10-CM

## 2013-03-09 ENCOUNTER — Ambulatory Visit
Admission: RE | Admit: 2013-03-09 | Discharge: 2013-03-09 | Disposition: A | Discharge status: Home / Self Care | Payer: Managed Care, Other (non HMO) | Source: Ambulatory Visit | Attending: Obstetrics and Gynecology | Admitting: Obstetrics and Gynecology

## 2013-03-09 ENCOUNTER — Other Ambulatory Visit (HOSPITAL_COMMUNITY): Payer: Self-pay | Admitting: Diagnostic Radiology

## 2013-03-09 DIAGNOSIS — R928 Other abnormal and inconclusive findings on diagnostic imaging of breast: Secondary | ICD-10-CM

## 2015-06-01 ENCOUNTER — Other Ambulatory Visit: Payer: Self-pay | Admitting: Obstetrics and Gynecology

## 2015-06-02 LAB — CYTOLOGY - PAP

## 2016-10-03 ENCOUNTER — Institutional Professional Consult (permissible substitution): Payer: Managed Care, Other (non HMO) | Admitting: Pulmonary Disease

## 2020-02-11 ENCOUNTER — Other Ambulatory Visit: Payer: Self-pay | Admitting: Student

## 2020-02-11 DIAGNOSIS — R1084 Generalized abdominal pain: Secondary | ICD-10-CM

## 2020-02-11 DIAGNOSIS — R748 Abnormal levels of other serum enzymes: Secondary | ICD-10-CM

## 2020-02-11 DIAGNOSIS — R112 Nausea with vomiting, unspecified: Secondary | ICD-10-CM

## 2020-02-17 ENCOUNTER — Ambulatory Visit
Admission: RE | Admit: 2020-02-17 | Discharge: 2020-02-17 | Disposition: A | Discharge status: Home / Self Care | Payer: Managed Care, Other (non HMO) | Source: Ambulatory Visit | Attending: Student | Admitting: Student

## 2020-02-17 ENCOUNTER — Other Ambulatory Visit: Payer: Self-pay

## 2020-02-17 DIAGNOSIS — R1084 Generalized abdominal pain: Secondary | ICD-10-CM | POA: Diagnosis not present

## 2020-02-17 DIAGNOSIS — R748 Abnormal levels of other serum enzymes: Secondary | ICD-10-CM | POA: Insufficient documentation

## 2020-02-17 DIAGNOSIS — R112 Nausea with vomiting, unspecified: Secondary | ICD-10-CM | POA: Insufficient documentation

## 2020-02-22 ENCOUNTER — Other Ambulatory Visit: Payer: Self-pay | Admitting: Neurology

## 2020-02-22 DIAGNOSIS — R42 Dizziness and giddiness: Secondary | ICD-10-CM

## 2020-03-02 ENCOUNTER — Other Ambulatory Visit: Payer: Self-pay

## 2020-03-02 ENCOUNTER — Other Ambulatory Visit: Payer: Self-pay | Admitting: Neurology

## 2020-03-02 ENCOUNTER — Ambulatory Visit
Admission: RE | Admit: 2020-03-02 | Discharge: 2020-03-02 | Disposition: A | Discharge status: Home / Self Care | Payer: Managed Care, Other (non HMO) | Source: Ambulatory Visit | Attending: Neurology | Admitting: Neurology

## 2020-03-02 DIAGNOSIS — R42 Dizziness and giddiness: Secondary | ICD-10-CM

## 2020-03-02 LAB — POCT I-STAT CREATININE: Creatinine, Ser: 1.7 mg/dL — ABNORMAL HIGH (ref 0.44–1.00)

## 2020-03-14 ENCOUNTER — Other Ambulatory Visit: Payer: Self-pay | Admitting: Neurology

## 2020-03-14 DIAGNOSIS — R9089 Other abnormal findings on diagnostic imaging of central nervous system: Secondary | ICD-10-CM

## 2020-03-24 ENCOUNTER — Other Ambulatory Visit: Payer: Self-pay

## 2020-03-24 ENCOUNTER — Ambulatory Visit
Admission: RE | Admit: 2020-03-24 | Discharge: 2020-03-24 | Disposition: A | Discharge status: Home / Self Care | Payer: Managed Care, Other (non HMO) | Source: Ambulatory Visit | Attending: Neurology | Admitting: Neurology

## 2020-03-24 DIAGNOSIS — R9089 Other abnormal findings on diagnostic imaging of central nervous system: Secondary | ICD-10-CM | POA: Diagnosis not present

## 2020-03-24 MED ORDER — GADOBUTROL 1 MMOL/ML IV SOLN
6.0000 mL | Freq: Once | INTRAVENOUS | Status: AC | PRN
  Administered 2020-03-24: 6 mL via INTRAVENOUS

## 2020-05-06 IMAGING — MR MR HEAD W/ CM
8 series · 48 of 48 positions shown · IV contrast (6ml Gadavist)
Comparison: MRI of the brain March 02, 2020.

CLINICAL DATA: Evaluation of lesion seen on prior MRI.

EXAM:
MRI HEAD WITH CONTRAST
TECHNIQUE: Multiplanar, multiecho pulse sequences of the brain and surrounding
structures were obtained with intravenous contrast.
CONTRAST:  6mL GADAVIST GADOBUTROL 1 MMOL/ML IV SOLN

[Series 6: FLAIR · axial · 3.0mm · 0.53mm/px · z∈[-77,+84]mm · 4 of 55 slices shown]
[im 1/55]
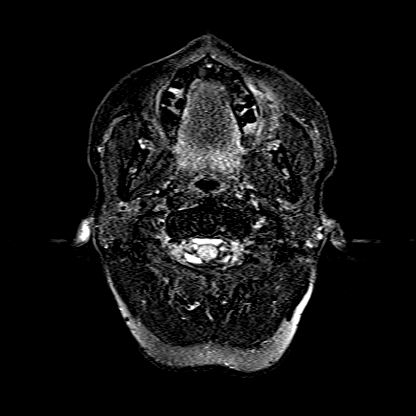
[im 19/55]
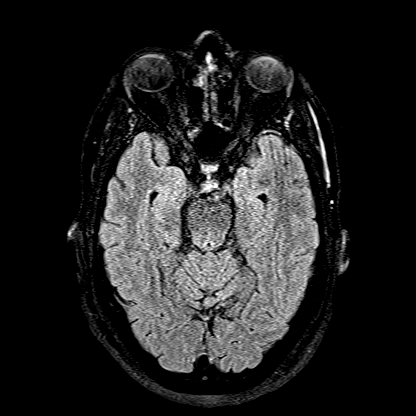
[im 37/55]
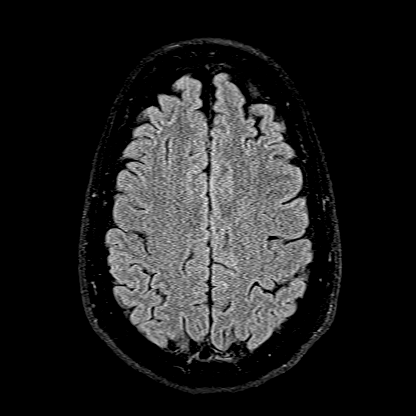
[im 55/55]
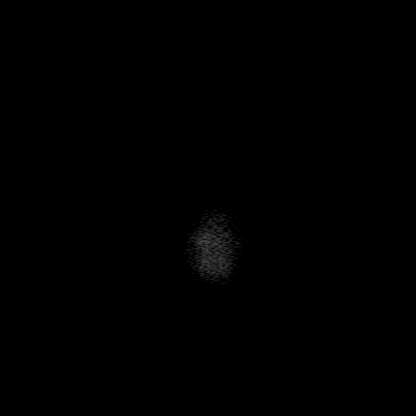

[Series 7: T1 · sagittal · 5.0mm · 0.62mm/px · 2 of 21 slices shown (1 of 2)]
[im 1/21]
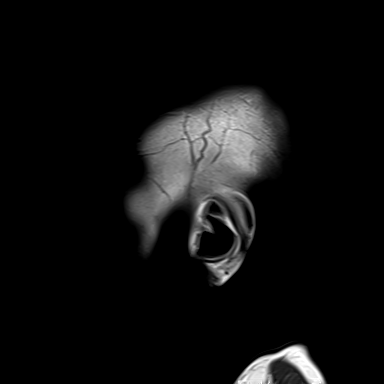
[im 21/21]
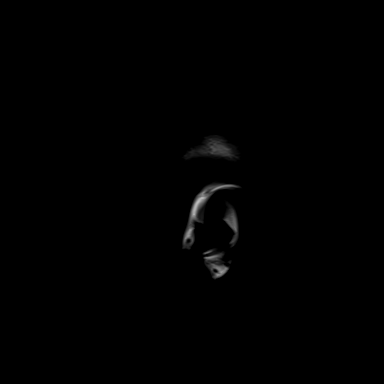

[Series 8: T2 · axial · 5.0mm · 0.53mm/px · z∈[-68,+75]mm · 2 of 25 slices shown]
[im 1/25]
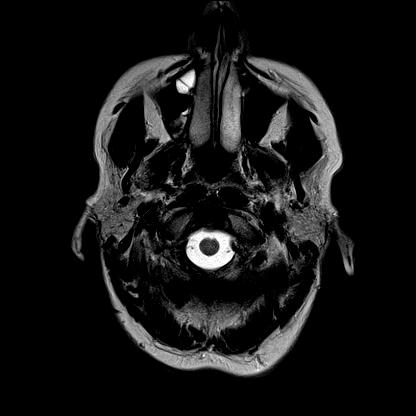
[im 25/25]
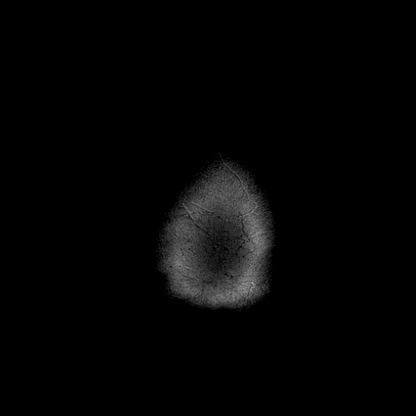

[Series 9: T1 · axial · 1.0mm · 0.98mm/px · z∈[-79,+95]mm · 16 of 176 slices shown (2 of 2)]
[im 1/176]
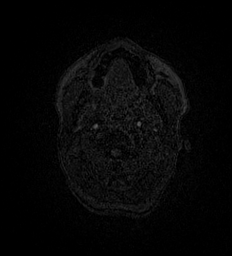
[im 12/176]
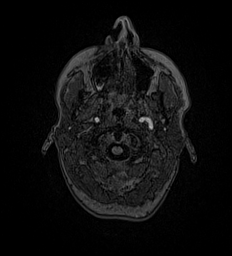
[im 24/176]
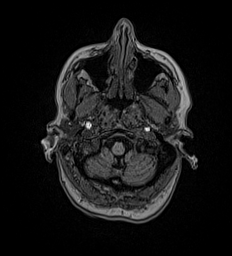
[im 36/176]
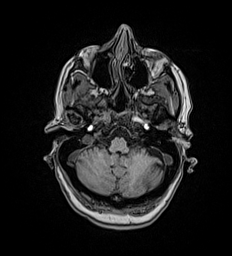
[im 47/176]
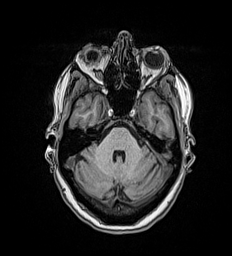
[im 59/176]
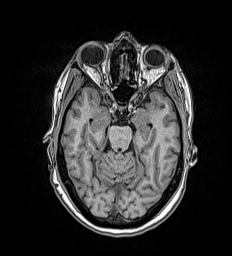
[im 71/176]
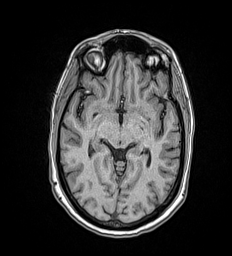
[im 82/176]
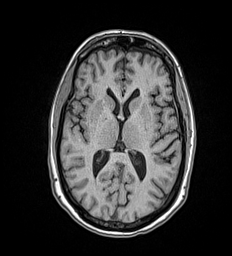
[im 94/176]
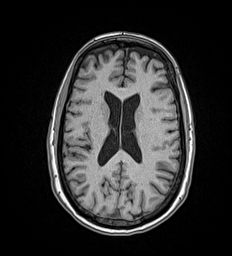
[im 106/176]
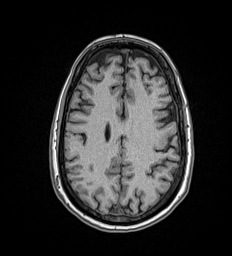
[im 117/176]
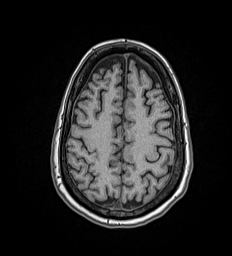
[im 129/176]
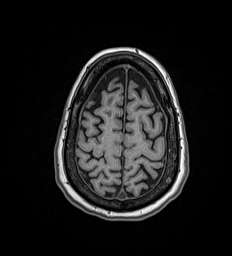
[im 141/176]
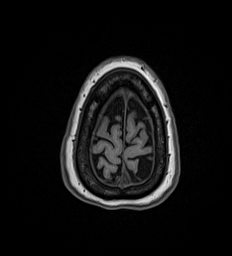
[im 152/176]
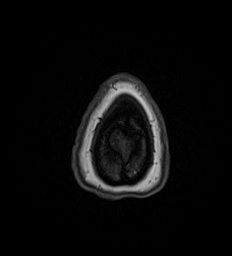
[im 164/176]
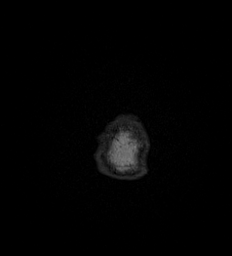
[im 176/176]
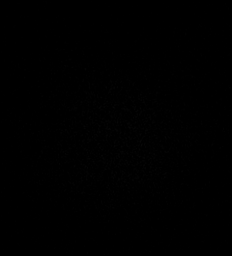

[Series 10: T2 post-contrast · coronal · 5.0mm · 0.57mm/px · 3 of 29 slices shown]
[im 1/29]
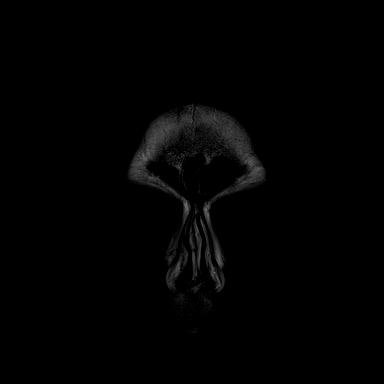
[im 15/29]
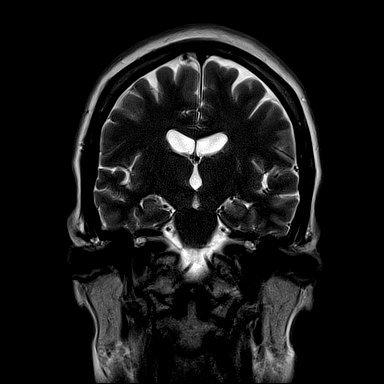
[im 29/29]
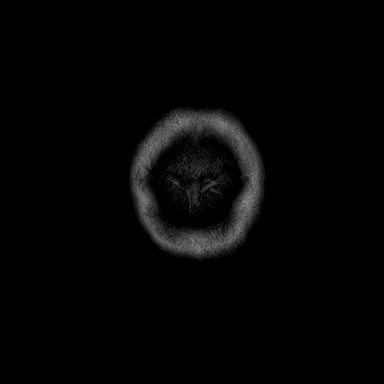

[Series 11: T1 post-contrast · axial · 1.0mm · 0.98mm/px · z∈[-79,+95]mm · 16 of 175 slices shown (1 of 3)]
[im 1/175]
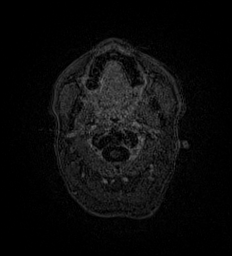
[im 12/175]
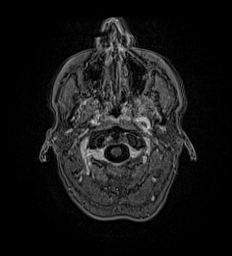
[im 24/175]
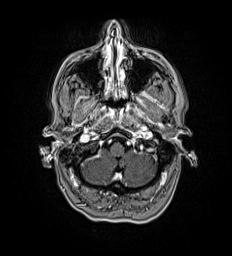
[im 35/175]
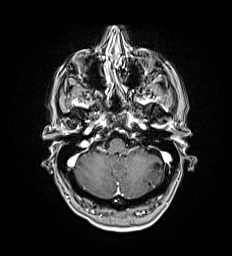
[im 47/175]
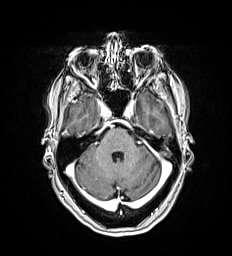
[im 59/175]
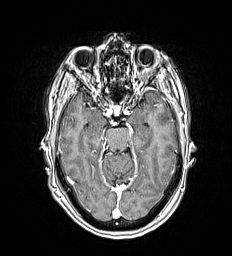
[im 70/175]
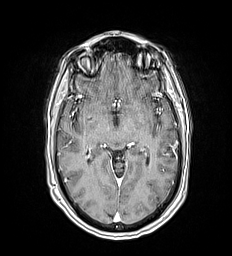
[im 82/175]
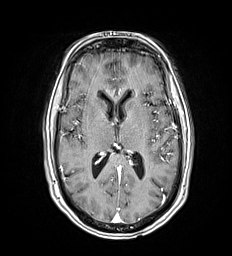
[im 93/175]
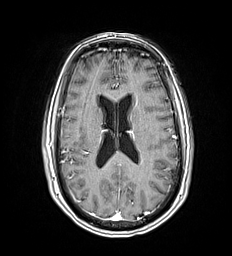
[im 105/175]
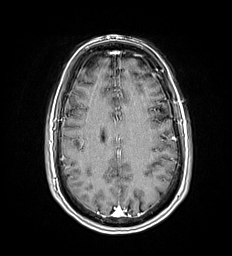
[im 117/175]
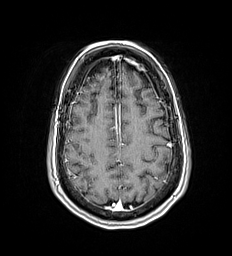
[im 128/175]
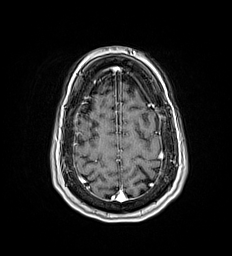
[im 140/175]
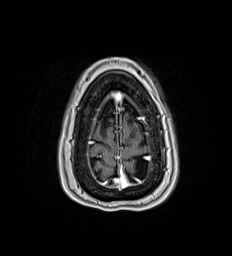
[im 151/175]
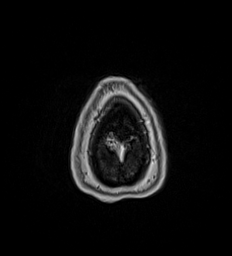
[im 163/175]
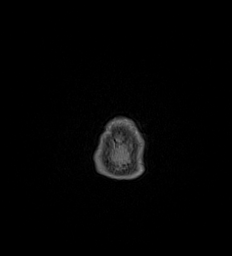
[im 175/175]
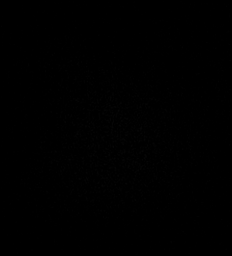

[Series 12: T1 post-contrast · coronal · 5.0mm · 0.57mm/px · 3 of 29 slices shown (2 of 3)]
[im 1/29]
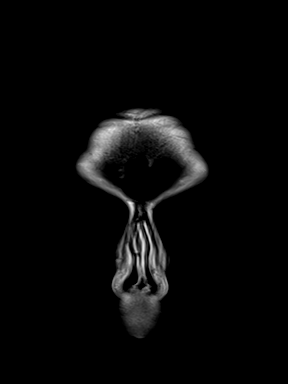
[im 15/29]
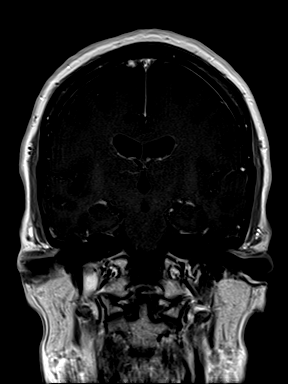
[im 29/29]
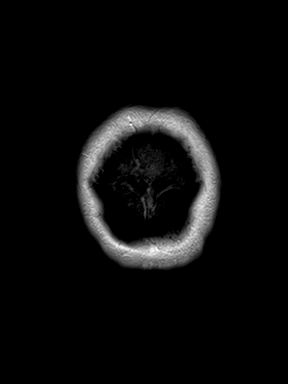

[Series 15: T1 post-contrast · sagittal · 5.0mm · 0.62mm/px · 2 of 21 slices shown (3 of 3)]
[im 1/21]
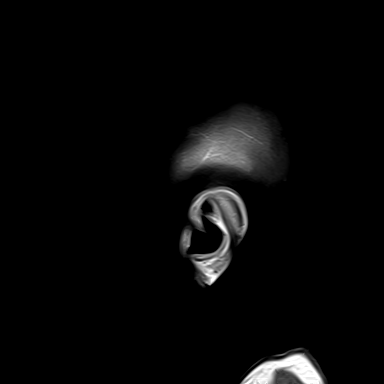
[im 21/21]
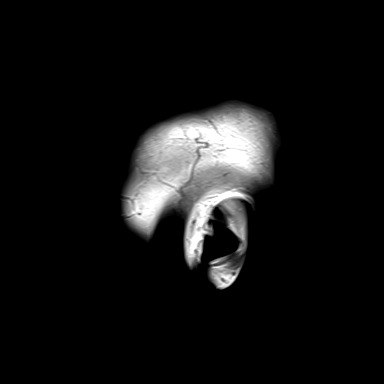

[48 of 48 positions shown; findings below may reference images not displayed]

FINDINGS: Brain: No acute infarction, hemorrhage, hydrocephalus or extra-axial
collection.

Redemonstrated mass lesion in the suprasellar cistern with
intermediate signal on T1 and low signal on T2 noting intense
contrast enhancement. The lesion appear dural-based on the
tuberculum sella and can be distinguished from the pituitary stalk
on sagittal postcontrast images(series 15, image 11), although the
lesion abuts the pituitary stalk. The lesion measures approximately
14 x 13 x 10 mm (T, AP, CC), displaces the cisternal segment of the
optic nerves and is in close contact to the bilateral ACA.

Vascular: Normal flow voids.

Skull and upper cervical spine: Normal marrow signal. Degenerative
changes of the upper cervical spine are noted.

Sinuses/Orbits: Mucous retention cyst in the bilateral maxillary
sinuses and mucosal thickening of the ethmoid cells. The orbits are
maintained.
IMPRESSION: Redemonstrated mass in the suprasellar cistern with imaging
characteristics most consistent with a meningioma of the tuberculum
sella. The lesion abuts the pituitary stalk, displaces the cisternal
segment of the optic nerves and is in close contact to bilateral
ACA.

## 2020-10-20 ENCOUNTER — Other Ambulatory Visit: Payer: Self-pay | Admitting: Neurosurgery

## 2020-10-20 DIAGNOSIS — D329 Benign neoplasm of meninges, unspecified: Secondary | ICD-10-CM

## 2020-11-21 ENCOUNTER — Other Ambulatory Visit: Payer: Self-pay

## 2020-11-21 ENCOUNTER — Ambulatory Visit
Admission: RE | Admit: 2020-11-21 | Discharge: 2020-11-21 | Disposition: A | Discharge status: Home / Self Care | Payer: Managed Care, Other (non HMO) | Source: Ambulatory Visit | Attending: Neurosurgery | Admitting: Neurosurgery

## 2020-11-21 DIAGNOSIS — D329 Benign neoplasm of meninges, unspecified: Secondary | ICD-10-CM | POA: Insufficient documentation

## 2020-11-21 MED ORDER — GADOBUTROL 1 MMOL/ML IV SOLN
9.0000 mL | Freq: Once | INTRAVENOUS | Status: AC | PRN
  Administered 2020-11-21: 9 mL via INTRAVENOUS

## 2020-12-18 ENCOUNTER — Telehealth: Payer: Self-pay | Admitting: Unknown Physician Specialty

## 2020-12-18 ENCOUNTER — Other Ambulatory Visit: Payer: Self-pay | Admitting: Unknown Physician Specialty

## 2020-12-18 ENCOUNTER — Encounter: Payer: Self-pay | Admitting: Unknown Physician Specialty

## 2020-12-18 DIAGNOSIS — I10 Essential (primary) hypertension: Secondary | ICD-10-CM

## 2020-12-18 DIAGNOSIS — U071 COVID-19: Secondary | ICD-10-CM

## 2020-12-18 NOTE — Telephone Encounter (Signed)
I connected by phone with Sydney Andrews on 12/18/2020 at 5:50 PM to discuss the potential use of a new treatment for mild to moderate COVID-19 viral infection in non-hospitalized patients.  This patient is a 64 y.o. female that meets the FDA criteria for Emergency Use Authorization of COVID monoclonal antibody casirivimab/imdevimab, bamlanivimab/etesevimab, or sotrovimab.  Has a (+) direct SARS-CoV-2 viral test result  Has mild or moderate COVID-19   Is NOT hospitalized due to COVID-19  Is within 10 days of symptom onset  Has at least one of the high risk factor(s) for progression to severe COVID-19 and/or hospitalization as defined in EUA.  Specific high risk criteria : Chronic Kidney Disease (CKD) and Cardiovascular disease or hypertension   I have spoken and communicated the following to the patient or parent/caregiver regarding COVID monoclonal antibody treatment:  1. FDA has authorized the emergency use for the treatment of mild to moderate COVID-19 in adults and pediatric patients with positive results of direct SARS-CoV-2 viral testing who are 95 years of age and older weighing at least 40 kg, and who are at high risk for progressing to severe COVID-19 and/or hospitalization.  2. The significant known and potential risks and benefits of COVID monoclonal antibody, and the extent to which such potential risks and benefits are unknown.  3. Information on available alternative treatments and the risks and benefits of those alternatives, including clinical trials.  4. Patients treated with COVID monoclonal antibody should continue to self-isolate and use infection control measures (e.g., wear mask, isolate, social distance, avoid sharing personal items, clean and disinfect high touch surfaces, and frequent handwashing) according to CDC guidelines.   5. The patient or parent/caregiver has the option to accept or refuse COVID monoclonal antibody treatment.  After reviewing this  information with the patient, the patient has agreed to receive one of the available covid 19 monoclonal antibodies and will be provided an appropriate fact sheet prior to infusion. Kathrine Haddock, NP 12/18/2020 5:50 PM   Sx onset 12/15

## 2020-12-18 NOTE — Telephone Encounter (Signed)
Called to Discuss with patient about Covid symptoms and the use of the monoclonal antibody infusion for those with mild to moderate Covid symptoms and at a high risk of hospitalization.     Pt appears to qualify for this infusion due to co-morbid conditions and/or a member of an at-risk group in accordance with the FDA Emergency Use Authorization.    Unable to reach pt  LMOM and mychart

## 2020-12-20 ENCOUNTER — Ambulatory Visit (HOSPITAL_COMMUNITY)
Admission: RE | Admit: 2020-12-20 | Discharge: 2020-12-20 | Disposition: A | Discharge status: Home / Self Care | Payer: Managed Care, Other (non HMO) | Source: Ambulatory Visit | Attending: Pulmonary Disease | Admitting: Pulmonary Disease

## 2020-12-20 DIAGNOSIS — U071 COVID-19: Secondary | ICD-10-CM | POA: Diagnosis present

## 2020-12-20 DIAGNOSIS — I10 Essential (primary) hypertension: Secondary | ICD-10-CM | POA: Diagnosis not present

## 2020-12-20 MED ORDER — EPINEPHRINE 0.3 MG/0.3ML IJ SOAJ: 0.3000 mg | Freq: Once | INTRAMUSCULAR | Status: DC | PRN

## 2020-12-20 MED ORDER — SODIUM CHLORIDE 0.9 % IV SOLN: INTRAVENOUS | Status: DC | PRN

## 2020-12-20 MED ORDER — SODIUM CHLORIDE 0.9 % IV SOLN: Freq: Once | INTRAVENOUS | Status: AC

## 2020-12-20 MED ORDER — ALBUTEROL SULFATE HFA 108 (90 BASE) MCG/ACT IN AERS: 2.0000 | INHALATION_SPRAY | Freq: Once | RESPIRATORY_TRACT | Status: DC | PRN

## 2020-12-20 MED ORDER — DIPHENHYDRAMINE HCL 50 MG/ML IJ SOLN: 50.0000 mg | Freq: Once | INTRAMUSCULAR | Status: DC | PRN

## 2020-12-20 MED ORDER — METHYLPREDNISOLONE SODIUM SUCC 125 MG IJ SOLR: 125.0000 mg | Freq: Once | INTRAMUSCULAR | Status: DC | PRN

## 2020-12-20 MED ORDER — FAMOTIDINE IN NACL 20-0.9 MG/50ML-% IV SOLN: 20.0000 mg | Freq: Once | INTRAVENOUS | Status: DC | PRN

## 2020-12-20 NOTE — Discharge Instructions (Signed)
10 Things You Can Do to Manage Your COVID-19 Symptoms at Home If you have possible or confirmed COVID-19: 1. Stay home from work and school. And stay away from other public places. If you must go out, avoid using any kind of public transportation, ridesharing, or taxis. 2. Monitor your symptoms carefully. If your symptoms get worse, call your healthcare provider immediately. 3. Get rest and stay hydrated. 4. If you have a medical appointment, call the healthcare provider ahead of time and tell them that you have or may have COVID-19. 5. For medical emergencies, call 911 and notify the dispatch personnel that you have or may have COVID-19. 6. Cover your cough and sneezes with a tissue or use the inside of your elbow. 7. Wash your hands often with soap and water for at least 20 seconds or clean your hands with an alcohol-based hand sanitizer that contains at least 60% alcohol. 8. As much as possible, stay in a specific room and away from other people in your home. Also, you should use a separate bathroom, if available. If you need to be around other people in or outside of the home, wear a mask. 9. Avoid sharing personal items with other people in your household, like dishes, towels, and bedding. 10. Clean all surfaces that are touched often, like counters, tabletops, and doorknobs. Use household cleaning sprays or wipes according to the label instructions. cdc.gov/coronavirus 07/01/2019 This information is not intended to replace advice given to you by your health care provider. Make sure you discuss any questions you have with your health care provider. Document Revised: 12/03/2019 Document Reviewed: 12/03/2019 Elsevier Patient Education  2020 [employer] What types of side effects do monoclonal antibody drugs cause?  Common side effects  In general, the more common side effects caused by monoclonal antibody drugs include: . Allergic reactions, such as hives or itching . Flu-like signs and  symptoms, including chills, fatigue, fever, and muscle aches and pains . Nausea, vomiting . Diarrhea . Skin rashes . Low blood pressure   The CDC is recommending patients who receive monoclonal antibody treatments wait at least 90 days before being vaccinated.  Currently, there are no data on the safety and efficacy of mRNA COVID-19 vaccines in persons who received monoclonal antibodies or convalescent plasma as part of COVID-19 treatment. Based on the estimated half-life of such therapies as well as evidence suggesting that reinfection is uncommon in the 90 days after initial infection, vaccination should be deferred for at least 90 days, as a precautionary measure until additional information becomes available, to avoid interference of the antibody treatment with vaccine-induced immune responses. If you have any questions or concerns after the infusion please call the Advanced Practice Provider on call at [phone]. This number is ONLY intended for your use regarding questions or concerns about the infusion post-treatment side-effects.  Please do not provide this number to others for use. For return to work notes please contact your primary care provider.   If someone you know is interested in receiving treatment please have them call the COVID hotline at [phone].   

## 2020-12-20 NOTE — Progress Notes (Signed)
Patient reviewed Fact Sheet for Patients, Parents, and Caregivers for Emergency Use Authorization (EUA) of bamlanivimab and etesevimab for the Treatment of Coronavirus. Patient also reviewed and is agreeable to the estimated cost of treatment. Patient is agreeable to proceed.   

## 2020-12-20 NOTE — Progress Notes (Signed)
  Diagnosis: COVID-19  Physician:Dr. Joya Gaskins  Procedure: [facility]fusion [facility] Med: bamlanivimab\etesevimab infusion - Provided patient with bamlanimivab\etesevimab fact sheet for patients, parents and caregivers prior to infusion.  Complications: No immediate complications noted.  Discharge: Discharged home   Ludwig Lean 12/20/2020

## 2021-04-27 ENCOUNTER — Ambulatory Visit: Payer: Managed Care, Other (non HMO) | Admitting: Internal Medicine

## 2021-04-28 ENCOUNTER — Ambulatory Visit: Payer: Managed Care, Other (non HMO) | Admitting: Internal Medicine

## 2021-05-31 DEATH — deceased
